# Patient Record
Sex: Female | Born: 2000 | Hispanic: Yes | Marital: Single | Smoking: Never smoker
Health system: Southern US, Community
[De-identification: ages and names within clinical notes are randomized; demographics above are authoritative.]

## PROBLEM LIST (undated history)

## (undated) HISTORY — PX: TONSILLECTOMY: SUR1361

---
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
